# Patient Record
Sex: Female | Born: 1980 | Race: White | Hispanic: No | Marital: Single | Smoking: Current every day smoker
Health system: Southern US, Community
[De-identification: ages and names within clinical notes are randomized; demographics above are authoritative.]

## PROBLEM LIST (undated history)

## (undated) ENCOUNTER — Inpatient Hospital Stay (HOSPITAL_COMMUNITY): Payer: Self-pay

## (undated) DIAGNOSIS — F419 Anxiety disorder, unspecified: Secondary | ICD-10-CM

## (undated) DIAGNOSIS — A749 Chlamydial infection, unspecified: Secondary | ICD-10-CM

## (undated) HISTORY — DX: Chlamydial infection, unspecified: A74.9

---

## 2012-06-16 ENCOUNTER — Emergency Department (HOSPITAL_BASED_OUTPATIENT_CLINIC_OR_DEPARTMENT_OTHER)
Admission: EM | Admit: 2012-06-16 | Discharge: 2012-06-16 | Disposition: A | Discharge status: Home / Self Care | Payer: Self-pay | Attending: Emergency Medicine | Admitting: Emergency Medicine

## 2012-06-16 ENCOUNTER — Encounter (HOSPITAL_BASED_OUTPATIENT_CLINIC_OR_DEPARTMENT_OTHER): Payer: Self-pay | Admitting: *Deleted

## 2012-06-16 ENCOUNTER — Emergency Department (HOSPITAL_BASED_OUTPATIENT_CLINIC_OR_DEPARTMENT_OTHER): Payer: Self-pay

## 2012-06-16 DIAGNOSIS — Z8659 Personal history of other mental and behavioral disorders: Secondary | ICD-10-CM | POA: Insufficient documentation

## 2012-06-16 DIAGNOSIS — Z3202 Encounter for pregnancy test, result negative: Secondary | ICD-10-CM | POA: Insufficient documentation

## 2012-06-16 DIAGNOSIS — F17200 Nicotine dependence, unspecified, uncomplicated: Secondary | ICD-10-CM | POA: Insufficient documentation

## 2012-06-16 DIAGNOSIS — R0789 Other chest pain: Secondary | ICD-10-CM

## 2012-06-16 DIAGNOSIS — R071 Chest pain on breathing: Secondary | ICD-10-CM | POA: Insufficient documentation

## 2012-06-16 DIAGNOSIS — R42 Dizziness and giddiness: Secondary | ICD-10-CM | POA: Insufficient documentation

## 2012-06-16 HISTORY — DX: Anxiety disorder, unspecified: F41.9

## 2012-06-16 LAB — CBC WITH DIFFERENTIAL/PLATELET
Basophils Absolute: 0 10*3/uL (ref 0.0–0.1)
Basophils Relative: 0 % (ref 0–1)
Hemoglobin: 13.2 g/dL (ref 12.0–15.0)
MCHC: 33.9 g/dL (ref 30.0–36.0)
Monocytes Relative: 8 % (ref 3–12)
Neutro Abs: 6.3 10*3/uL (ref 1.7–7.7)
Neutrophils Relative %: 63 % (ref 43–77)
RDW: 13 % (ref 11.5–15.5)

## 2012-06-16 LAB — BASIC METABOLIC PANEL
Chloride: 104 mEq/L (ref 96–112)
GFR calc Af Amer: >90 mL/min (ref >90)
Potassium: 3.8 mEq/L (ref 3.5–5.1)

## 2012-06-16 LAB — PREGNANCY, URINE: Preg Test, Ur: NEGATIVE

## 2012-06-16 MED ORDER — KETOROLAC TROMETHAMINE 30 MG/ML IJ SOLN
30.0000 mg | Freq: Once | INTRAMUSCULAR | Status: AC
  Administered 2012-06-16: 30 mg via INTRAVENOUS
  Filled 2012-06-16: qty 1

## 2012-06-16 NOTE — ED Provider Notes (Addendum)
History    This chart was scribed for Dione Booze, MD by Donne Anon, ED Scribe. This patient was seen in room MH08/MH08 and the patient's care was started at 1743.   CSN: [id number]  Arrival date & time 06/16/12  1720   First MD Initiated Contact with Patient 06/16/12 1743      Chief Complaint  Patient presents with  . Chest Pain     The history is provided by the patient. No language interpreter was used.   Maria Mendez is a 32 y.o. female who presents to the Emergency Department complaining of sudden onset, intermittent, unchanging left sided CP described as throbbing which began 4 hours PTA while she was driving. She reports the pain is worse when sitting up and laying down. She reports nothing makes it feel better. She reports associated dizziness. She denies SOB, nausea, emesis or any other pain. She has tried nitroglycerin which made the pain worse. She reports she is otherwise healthy and has not had any recent colds. She reports 1 prior episode years ago.  Pt is a current everyday smoker (.5 pack/day) but denies alcohol use. She does not have a PCP.   Past Medical History  Diagnosis Date  . Anxiety     History reviewed. No pertinent past surgical history.  History reviewed. No pertinent family history.  History  Substance Use Topics  . Smoking status: Current Every Day Smoker -- 0.50 packs/day    Types: Cigarettes  . Smokeless tobacco: Not on file  . Alcohol Use: No     Review of Systems  All other systems reviewed and are negative.    Allergies  Review of patient's allergies indicates no known allergies.  Home Medications  No current outpatient prescriptions on file.  Triage Vitals; BP 112/69  Pulse 81  Temp(Src) 98.3 F (36.8 C) (Oral)  Resp 16  Ht 5\' 3"  (1.6 m)  Wt 186 lb (84.369 kg)  BMI 32.96 kg/m2  SpO2 99%  LMP 05/28/2012  Physical Exam  Nursing note and vitals reviewed. Constitutional: She is oriented to person, place, and time. She  appears well-developed and well-nourished. No distress.  HENT:  Head: Normocephalic and atraumatic.  Eyes: EOM are normal.  Neck: Neck supple. No tracheal deviation present.  Cardiovascular: Normal rate.   Pulmonary/Chest: Effort normal. No respiratory distress. She exhibits tenderness (left anterior chest wall).  Musculoskeletal: Normal range of motion.  Neurological: She is alert and oriented to person, place, and time.  Skin: Skin is warm and dry.  Psychiatric: She has a normal mood and affect. Her behavior is normal.    ED Course  Procedures (including critical care time) DIAGNOSTIC STUDIES: Oxygen Saturation is 99% on room air, normal by my interpretation.    COORDINATION OF CARE: 5:59 PM Discussed treatment plan which includes xray and blood work with pt at bedside and pt agreed to plan.   Results for orders placed during the hospital encounter of 06/16/12  CBC WITH DIFFERENTIAL      Result Value Range   WBC 10.0  4.0 - 10.5 K/uL   RBC 4.10  3.87 - 5.11 MIL/uL   Hemoglobin 13.2  12.0 - 15.0 g/dL   HCT 21.3  08.6 - 57.8 %   MCV 94.9  78.0 - 100.0 fL   MCH 32.2  26.0 - 34.0 pg   MCHC 33.9  30.0 - 36.0 g/dL   RDW 46.9  62.9 - 52.8 %   Platelets 276  150 - 400 K/uL  Neutrophils Relative 63  43 - 77 %   Neutro Abs 6.3  1.7 - 7.7 K/uL   Lymphocytes Relative 27  12 - 46 %   Lymphs Abs 2.7  0.7 - 4.0 K/uL   Monocytes Relative 8  3 - 12 %   Monocytes Absolute 0.8  0.1 - 1.0 K/uL   Eosinophils Relative 2  0 - 5 %   Eosinophils Absolute 0.2  0.0 - 0.7 K/uL   Basophils Relative 0  0 - 1 %   Basophils Absolute 0.0  0.0 - 0.1 K/uL  BASIC METABOLIC PANEL      Result Value Range   Sodium 140  135 - 145 mEq/L   Potassium 3.8  3.5 - 5.1 mEq/L   Chloride 104  96 - 112 mEq/L   CO2 26  19 - 32 mEq/L   Glucose, Bld 109 (*) 70 - 99 mg/dL   BUN 8  6 - 23 mg/dL   Creatinine, Ser 1.61  0.50 - 1.10 mg/dL   Calcium 9.5  8.4 - 09.6 mg/dL   GFR calc non Af Amer >90  >90 mL/min   GFR  calc Af Amer >90  >90 mL/min  PREGNANCY, URINE      Result Value Range   Preg Test, Ur NEGATIVE  NEGATIVE   Dg Chest 2 View  06/16/2012  *RADIOLOGY REPORT*  Clinical Data: Chest pain.  CHEST - 2 VIEW  Comparison: None  Findings: Heart and mediastinal contours are within normal limits. No focal opacities or effusions.  No acute bony abnormality.  IMPRESSION: No active cardiopulmonary disease.   Original Report Authenticated By: Charlett Nose, M.D.    ECG shows normal sinus rhythm with a rate of 86, no ectopy. Normal axis. Normal P wave. Normal QRS. Normal intervals. Normal ST and T waves. Impression: normal ECG. No prior ECG available for comparison   1. Chest wall pain       MDM  Chest pain which seems most likely be chest wall pain-possible costochondritis. Chest x-ray or be obtained and she will be given a therapeutic trial of ketorolac.  She feels much better after ketorolac. Laboratory and x-ray studies are normal. She is sent home with instructions to use over-the-counter NSAIDs as needed.  I personally performed the services described in this documentation, which was scribed in my presence. The recorded information has been reviewed and is accurate.           Dione Booze, MD 06/16/12 [place]  Dione Booze, MD 06/16/12 2001

## 2012-06-16 NOTE — ED Notes (Signed)
Patient transported to X-ray 

## 2012-06-16 NOTE — ED Notes (Signed)
Pt c/o cp while standing in [employer] x 3 hrs ago , pt reports she took a fmaily members nitro which made it worse

## 2012-10-01 LAB — OB RESULTS CONSOLE HGB/HCT, BLOOD: HCT: 38 %

## 2012-10-13 DIAGNOSIS — A749 Chlamydial infection, unspecified: Secondary | ICD-10-CM

## 2012-10-13 HISTORY — DX: Chlamydial infection, unspecified: A74.9

## 2012-10-13 LAB — OB RESULTS CONSOLE GC/CHLAMYDIA
Chlamydia: POSITIVE
Chlamydia: POSITIVE
Gonorrhea: NEGATIVE

## 2012-10-13 LAB — OB RESULTS CONSOLE RPR: RPR: NONREACTIVE

## 2012-10-13 LAB — SICKLE CELL SCREEN: Sickle Cell Screen: NORMAL

## 2012-10-13 LAB — OB RESULTS CONSOLE VARICELLA ZOSTER ANTIBODY, IGG: Varicella: IMMUNE

## 2012-10-14 ENCOUNTER — Other Ambulatory Visit (HOSPITAL_COMMUNITY): Payer: Self-pay | Admitting: Obstetrics and Gynecology

## 2012-10-14 DIAGNOSIS — Z3682 Encounter for antenatal screening for nuchal translucency: Secondary | ICD-10-CM

## 2012-10-24 ENCOUNTER — Encounter: Payer: Self-pay | Admitting: Obstetrics and Gynecology

## 2012-10-27 ENCOUNTER — Encounter: Payer: Self-pay | Admitting: Obstetrics & Gynecology

## 2012-11-03 ENCOUNTER — Encounter: Payer: Self-pay | Admitting: *Deleted

## 2012-11-06 ENCOUNTER — Encounter (HOSPITAL_COMMUNITY): Payer: Self-pay | Admitting: Obstetrics and Gynecology

## 2012-11-14 ENCOUNTER — Ambulatory Visit (HOSPITAL_COMMUNITY): Payer: Self-pay

## 2014-01-30 IMAGING — CR DG CHEST 2V
2 series · 2 of 2 positions shown · non-contrast
Comparison: None

CLINICAL DATA: Chest pain.

CHEST - 2 VIEW

[w chest pa]
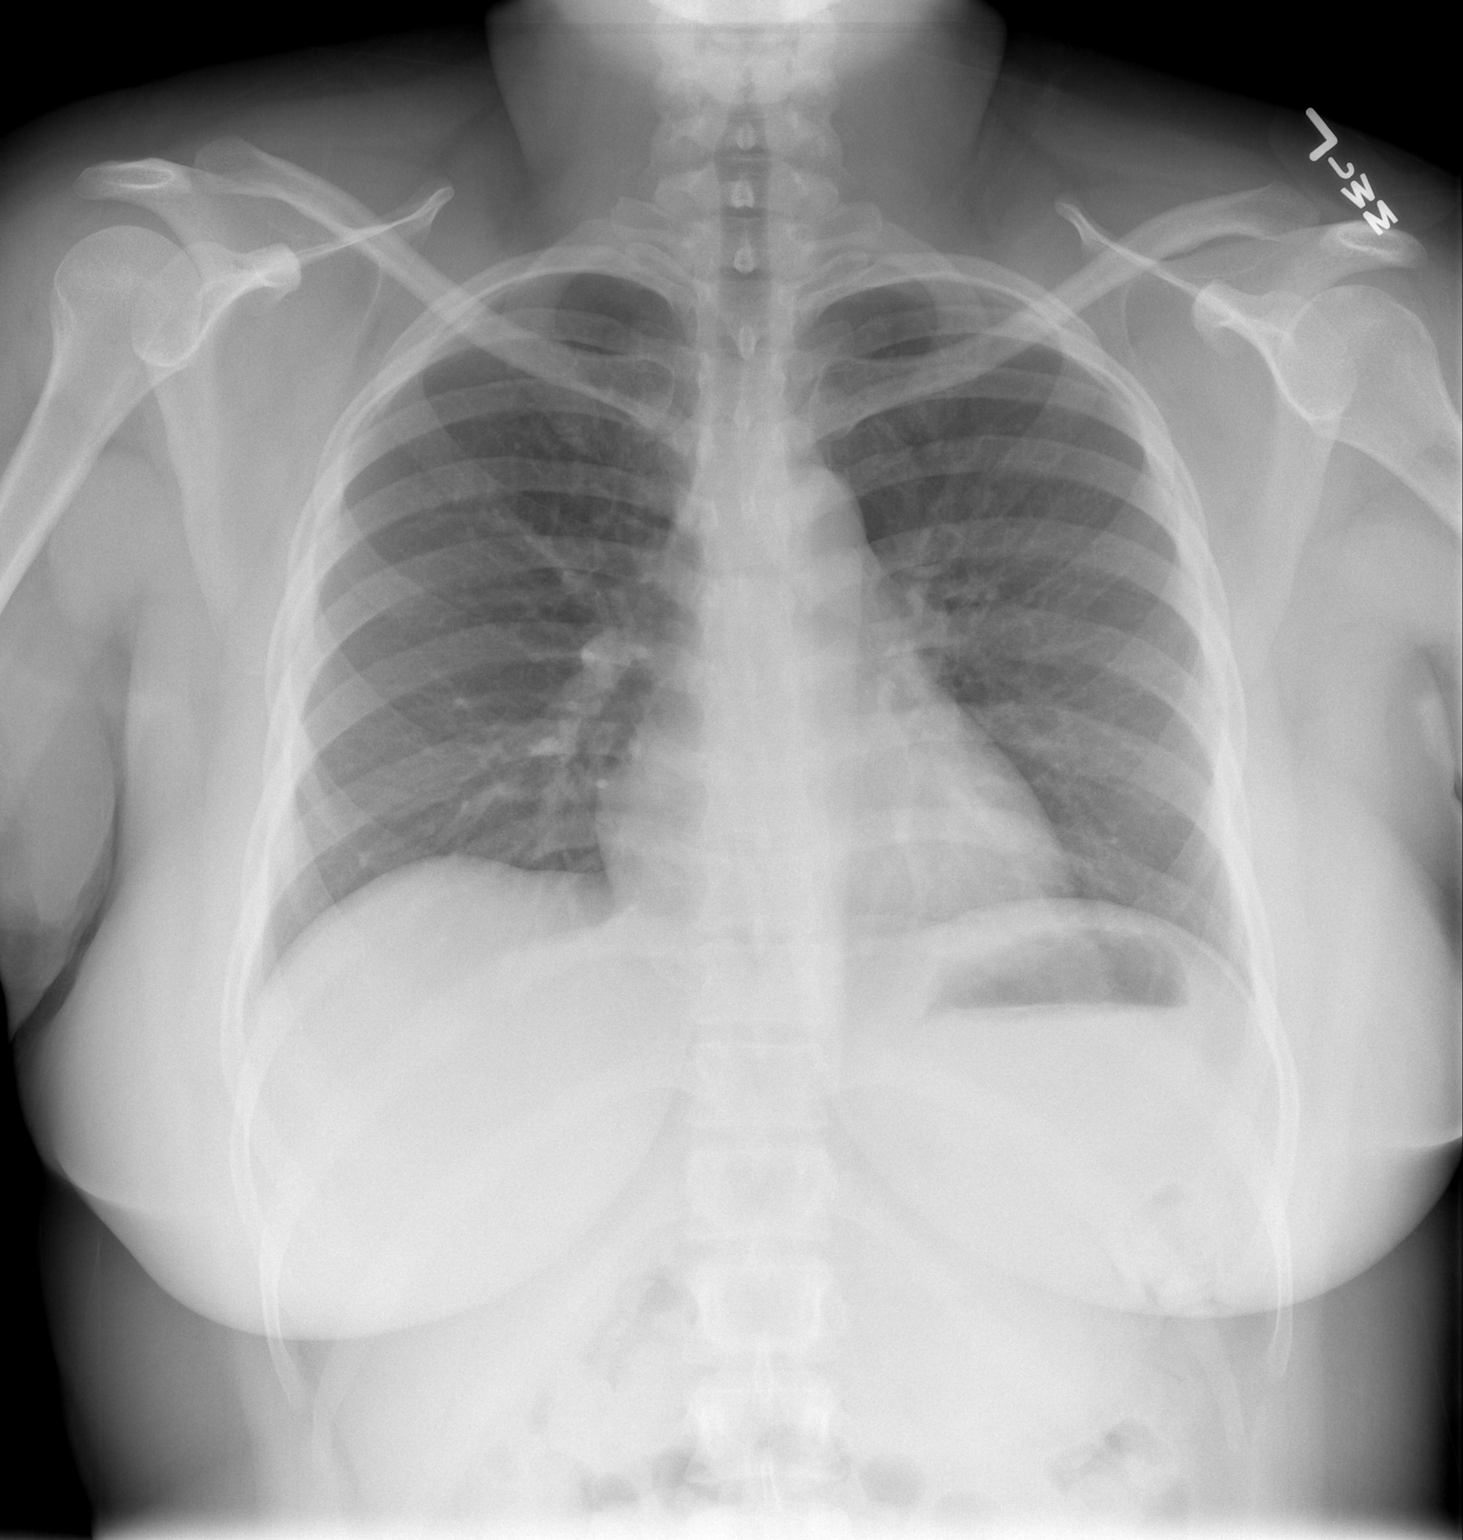

[w chest lat]
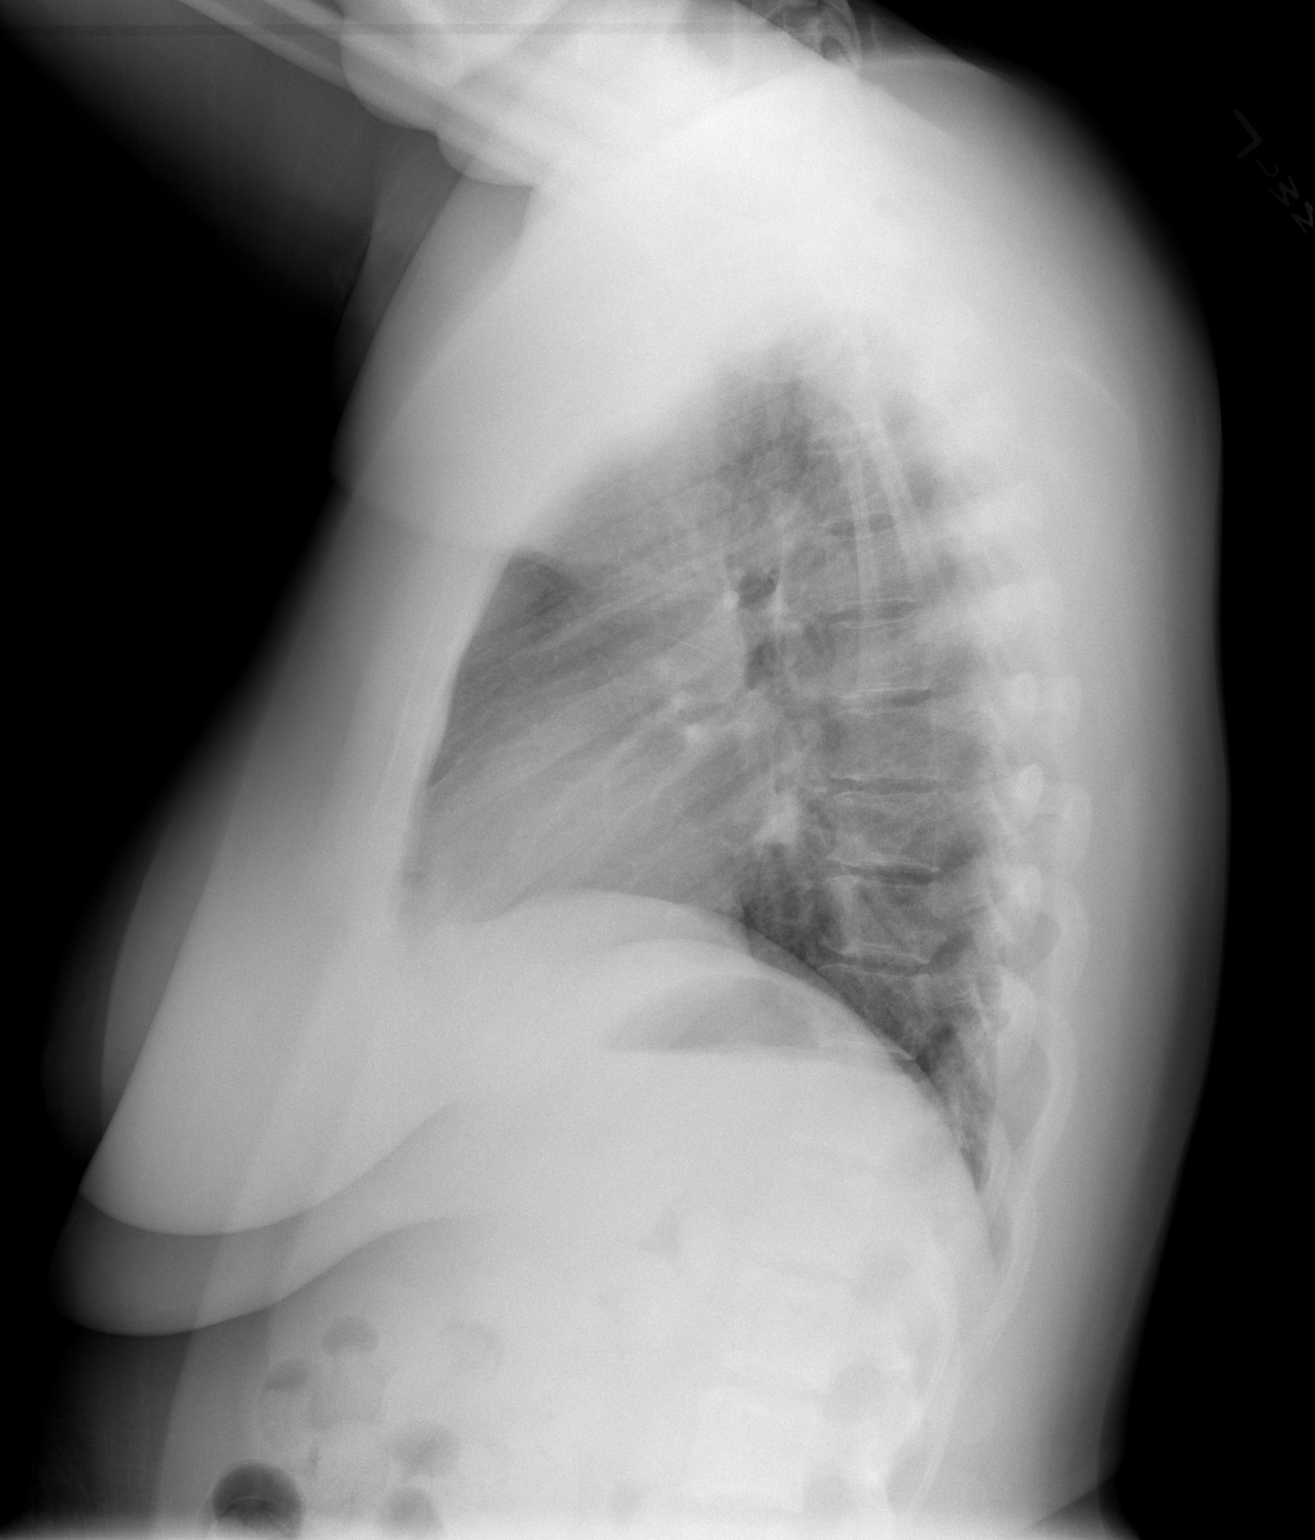

[2 of 2 positions shown; findings below may reference images not displayed]

FINDINGS: Heart and mediastinal contours are within normal limits.
No focal opacities or effusions.  No acute bony abnormality.
IMPRESSION: No active cardiopulmonary disease.

## 2015-04-08 ENCOUNTER — Emergency Department (HOSPITAL_BASED_OUTPATIENT_CLINIC_OR_DEPARTMENT_OTHER)
Admission: EM | Admit: 2015-04-08 | Discharge: 2015-04-08 | Disposition: A | Discharge status: Home / Self Care | Payer: Self-pay | Attending: Emergency Medicine | Admitting: Emergency Medicine

## 2015-04-08 ENCOUNTER — Encounter (HOSPITAL_BASED_OUTPATIENT_CLINIC_OR_DEPARTMENT_OTHER): Payer: Self-pay | Admitting: *Deleted

## 2015-04-08 DIAGNOSIS — S39012A Strain of muscle, fascia and tendon of lower back, initial encounter: Secondary | ICD-10-CM | POA: Insufficient documentation

## 2015-04-08 DIAGNOSIS — F419 Anxiety disorder, unspecified: Secondary | ICD-10-CM | POA: Insufficient documentation

## 2015-04-08 DIAGNOSIS — Z8619 Personal history of other infectious and parasitic diseases: Secondary | ICD-10-CM | POA: Insufficient documentation

## 2015-04-08 DIAGNOSIS — Y998 Other external cause status: Secondary | ICD-10-CM | POA: Insufficient documentation

## 2015-04-08 DIAGNOSIS — Y9389 Activity, other specified: Secondary | ICD-10-CM | POA: Insufficient documentation

## 2015-04-08 DIAGNOSIS — M545 Low back pain, unspecified: Secondary | ICD-10-CM

## 2015-04-08 DIAGNOSIS — X58XXXA Exposure to other specified factors, initial encounter: Secondary | ICD-10-CM | POA: Insufficient documentation

## 2015-04-08 DIAGNOSIS — F17210 Nicotine dependence, cigarettes, uncomplicated: Secondary | ICD-10-CM | POA: Insufficient documentation

## 2015-04-08 DIAGNOSIS — Z3202 Encounter for pregnancy test, result negative: Secondary | ICD-10-CM | POA: Insufficient documentation

## 2015-04-08 DIAGNOSIS — T148XXA Other injury of unspecified body region, initial encounter: Secondary | ICD-10-CM

## 2015-04-08 DIAGNOSIS — Y9289 Other specified places as the place of occurrence of the external cause: Secondary | ICD-10-CM | POA: Insufficient documentation

## 2015-04-08 LAB — URINALYSIS, ROUTINE W REFLEX MICROSCOPIC
Bilirubin Urine: NEGATIVE
GLUCOSE, UA: NEGATIVE mg/dL
KETONES UR: NEGATIVE mg/dL
Nitrite: NEGATIVE
PH: 6 (ref 5.0–8.0)
PROTEIN: NEGATIVE mg/dL
Specific Gravity, Urine: 1.02 (ref 1.005–1.030)

## 2015-04-08 LAB — URINE MICROSCOPIC-ADD ON

## 2015-04-08 LAB — PREGNANCY, URINE: PREG TEST UR: NEGATIVE

## 2015-04-08 MED ORDER — CYCLOBENZAPRINE HCL 10 MG PO TABS
10.0000 mg | ORAL_TABLET | Freq: Once | ORAL | Status: DC
  Filled 2015-04-08: qty 1

## 2015-04-08 MED ORDER — KETOROLAC TROMETHAMINE 60 MG/2ML IM SOLN
60.0000 mg | Freq: Once | INTRAMUSCULAR | Status: AC
  Administered 2015-04-08: 60 mg via INTRAMUSCULAR
  Filled 2015-04-08: qty 2

## 2015-04-08 MED ORDER — DIAZEPAM 5 MG PO TABS: 5.0000 mg | ORAL_TABLET | Freq: Three times a day (TID) | ORAL | Status: DC | PRN

## 2015-04-08 MED ORDER — IBUPROFEN 600 MG PO TABS: 600.0000 mg | ORAL_TABLET | Freq: Four times a day (QID) | ORAL | Status: DC | PRN

## 2015-04-08 NOTE — ED Provider Notes (Signed)
CSN: [id number]     Arrival date & time 04/08/15  0848 History   First MD Initiated Contact with Patient 04/08/15 [phone]     Chief Complaint  Patient presents with  . Back Pain     (Consider location/radiation/quality/duration/timing/severity/associated sxs/prior Treatment) HPI 34 year old female who presents with back pain. History of intermittent back pain. Woke up this morning with stiff back and spasms, which seem to radiated to the left side of her thigh. Has had mild back pain over the course of the past several days, but this morning seemed to be much worse. Has not had any numbness or weakness, urinary retention, fecal or urinary incontinence, abdominal pain, dysuria, urinary frequency, hematuria, vaginal bleeding or discharge, nausea or vomiting, fevers or chills. No recent falls or trauma. Past Medical History  Diagnosis Date  . Anxiety   . Chlamydia 10/13/12    Treated 10/16/12 @ [facility]   History reviewed. No pertinent past surgical history. Family History  Problem Relation Age of Onset  . Diabetes Mother   . Cancer Mother     bone  . Cancer Father     skin   Social History  Substance Use Topics  . Smoking status: Current Every Day Smoker -- 0.50 packs/day    Types: Cigarettes  . Smokeless tobacco: None  . Alcohol Use: No   OB History    No data available     Review of Systems 10/14 systems reviewed and are negative other than those stated in the HPI   Allergies  Review of patient's allergies indicates no known allergies.  Home Medications   Prior to Admission medications   Medication Sig Start Date End Date Taking? Authorizing Provider  diazepam (VALIUM) 5 MG tablet Take 1 tablet (5 mg total) by mouth every 8 (eight) hours as needed for muscle spasms. 04/08/15   Lavera Guise, MD  ibuprofen (ADVIL,MOTRIN) 600 MG tablet Take 1 tablet (600 mg total) by mouth every 6 (six) hours as needed. 04/08/15   Lavera Guise, MD   BP 122/75 mmHg  Pulse 76  Temp(Src) 98.6 F  (37 C) (Oral)  Resp 20  Ht  (1.6 m)  Wt 182 lb (82.555 kg)  BMI 32.25 kg/m2  SpO2 100% Physical Exam Physical Exam  Nursing note and vitals reviewed. Constitutional: Well developed, well nourished, non-toxic, and in no acute distress Head: Normocephalic and atraumatic.  Mouth/Throat: Oropharynx is clear and moist.  Neck: Normal range of motion. Neck supple.  Cardiovascular: Normal rate and regular rhythm.   Pulmonary/Chest: Effort normal and breath sounds normal.  Abdominal: Soft. There is no tenderness. There is no rebound and no guarding.  Musculoskeletal: No midline tenderness of the TLS spine. There is bilateral paraspinal muscle tenderness to palpation in the lumbar region of her spine. There is no CVA tenderness.  Neurological: Alert, no facial droop, fluent speech, full strength in hip flexion/extension, knee flexion/extension, ankle dorsi and plantar flexion, intact sensation in bilateral lower extremities due to light touch Skin: Skin is warm and dry.  Psychiatric: Cooperative  ED Course  Procedures (including critical care time) Labs Review Labs Reviewed  URINALYSIS, ROUTINE W REFLEX MICROSCOPIC (NOT AT [facility]) - Abnormal; Notable for the following:    APPearance CLOUDY (*)    Hgb urine dipstick LARGE (*)    Leukocytes, UA MODERATE (*)    All other components within normal limits  URINE MICROSCOPIC-ADD ON - Abnormal; Notable for the following:    Squamous Epithelial /  LPF 6-30 (*)    Bacteria, UA MANY (*)    All other components within normal limits  PREGNANCY, URINE    Imaging Review No results found. I have personally reviewed and evaluated these images and lab results as part of my medical decision-making.    MDM   Final diagnoses:  Bilateral low back pain without sciatica  Muscle strain    34 year old female with low back pain. Is well-appearing and in no acute distress. Vital signs are non-concerning. She has a soft and benign abdomen. His no CVA  tenderness or urinary symptoms to be concerned for pyelonephritis or urolithiasis. She has reproducible paraspinal tenderness of her lumbar spine bilaterally, and is neurologically intact. No concerning features of her history or exam that would be concerning for serious spinal cord or spine pathology. Is given Toradol and Flexeril for symptomatic control, to good effect. Discussed supportive care instructions for home. Strict return and follow-up instructions are reviewed. She expressed understanding of all discharge instructions and felt comfortable with the plan of care.    Lavera Guiseana Duo Brooks Kinnan, MD 04/08/15 [phone]

## 2015-04-08 NOTE — ED Notes (Signed)
Pt amb to restroom for repeat urine sample. Pt is unable to void. Pt states she has to go and pick up her children from school. md is aware.

## 2015-04-08 NOTE — ED Notes (Signed)
MD at bedside. 

## 2015-04-08 NOTE — Discharge Instructions (Signed)
Please return without fail for worsening symptoms, including worsening pain, difficulty walking, numbness or weakness, loss of control of her bowel or bladder, fevers, vomiting unable to keep down food or fluids, or any other symptoms concerning to you.  Back Pain, Adult Back pain is very common in adults.The cause of back pain is rarely dangerous and the pain often gets better over time.The cause of your back pain may not be known. Some common causes of back pain include:  Strain of the muscles or ligaments supporting the spine.  Wear and tear (degeneration) of the spinal disks.  Arthritis.  Direct injury to the back. For many people, back pain may return. Since back pain is rarely dangerous, most people can learn to manage this condition on their own. HOME CARE INSTRUCTIONS Watch your back pain for any changes. The following actions may help to lessen any discomfort you are feeling:  Remain active. It is stressful on your back to sit or stand in one place for long periods of time. Do not sit, drive, or stand in one place for more than 30 minutes at a time. Take short walks on even surfaces as soon as you are able.Try to increase the length of time you walk each day.  Exercise regularly as directed by your health care provider. Exercise helps your back heal faster. It also helps avoid future injury by keeping your muscles strong and flexible.  Do not stay in bed.Resting more than 1-2 days can delay your recovery.  Pay attention to your body when you bend and lift. The most comfortable positions are those that put less stress on your recovering back. Always use proper lifting techniques, including:  Bending your knees.  Keeping the load close to your body.  Avoiding twisting.  Find a comfortable position to sleep. Use a firm mattress and lie on your side with your knees slightly bent. If you lie on your back, put a pillow under your knees.  Avoid feeling anxious or  stressed.Stress increases muscle tension and can worsen back pain.It is important to recognize when you are anxious or stressed and learn ways to manage it, such as with exercise.  Take medicines only as directed by your health care provider. Over-the-counter medicines to reduce pain and inflammation are often the most helpful.Your health care provider may prescribe muscle relaxant drugs.These medicines help dull your pain so you can more quickly return to your normal activities and healthy exercise.  Apply ice to the injured area:  Put ice in a plastic bag.  Place a towel between your skin and the bag.  Leave the ice on for 20 minutes, 2-3 times a day for the first 2-3 days. After that, ice and heat may be alternated to reduce pain and spasms.  Maintain a healthy weight. Excess weight puts extra stress on your back and makes it difficult to maintain good posture. SEEK MEDICAL CARE IF:  You have pain that is not relieved with rest or medicine.  You have increasing pain going down into the legs or buttocks.  You have pain that does not improve in one week.  You have night pain.  You lose weight.  You have a fever or chills. SEEK IMMEDIATE MEDICAL CARE IF:   You develop new bowel or bladder control problems.  You have unusual weakness or numbness in your arms or legs.  You develop nausea or vomiting.  You develop abdominal pain.  You feel faint.   This information is not intended to  replace advice given to you by your health care provider. Make sure you discuss any questions you have with your health care provider.   Document Released: 04/23/2005 Document Revised: 05/14/2014 Document Reviewed: 08/25/2013 Elsevier Interactive Patient Education [employer].

## 2015-04-08 NOTE — ED Notes (Signed)
Pt amb to room 11 reporting back pain x Wednesday. Pt states "I wonder if I'm pregnant?" pt amb to restroom for cc urine sample. Pt denies any injury or trauma.

## 2015-08-17 ENCOUNTER — Encounter (HOSPITAL_COMMUNITY): Payer: Self-pay | Admitting: *Deleted

## 2015-08-17 ENCOUNTER — Inpatient Hospital Stay (HOSPITAL_COMMUNITY): Payer: Medicaid Other

## 2015-08-17 ENCOUNTER — Inpatient Hospital Stay (HOSPITAL_COMMUNITY)
Admission: AD | Admit: 2015-08-17 | Discharge: 2015-08-17 | Disposition: A | Discharge status: Home / Self Care | Payer: Medicaid Other | Source: Ambulatory Visit | Attending: Obstetrics and Gynecology | Admitting: Obstetrics and Gynecology

## 2015-08-17 DIAGNOSIS — O26852 Spotting complicating pregnancy, second trimester: Secondary | ICD-10-CM | POA: Diagnosis present

## 2015-08-17 DIAGNOSIS — Z8619 Personal history of other infectious and parasitic diseases: Secondary | ICD-10-CM | POA: Insufficient documentation

## 2015-08-17 DIAGNOSIS — Z3A14 14 weeks gestation of pregnancy: Secondary | ICD-10-CM | POA: Diagnosis not present

## 2015-08-17 DIAGNOSIS — O209 Hemorrhage in early pregnancy, unspecified: Secondary | ICD-10-CM | POA: Insufficient documentation

## 2015-08-17 DIAGNOSIS — O4692 Antepartum hemorrhage, unspecified, second trimester: Secondary | ICD-10-CM

## 2015-08-17 DIAGNOSIS — Z833 Family history of diabetes mellitus: Secondary | ICD-10-CM | POA: Diagnosis not present

## 2015-08-17 DIAGNOSIS — F419 Anxiety disorder, unspecified: Secondary | ICD-10-CM | POA: Insufficient documentation

## 2015-08-17 DIAGNOSIS — F17210 Nicotine dependence, cigarettes, uncomplicated: Secondary | ICD-10-CM | POA: Diagnosis not present

## 2015-08-17 DIAGNOSIS — Z808 Family history of malignant neoplasm of other organs or systems: Secondary | ICD-10-CM | POA: Diagnosis not present

## 2015-08-17 LAB — URINALYSIS, ROUTINE W REFLEX MICROSCOPIC
Bilirubin Urine: NEGATIVE
GLUCOSE, UA: NEGATIVE mg/dL
Ketones, ur: NEGATIVE mg/dL
Leukocytes, UA: NEGATIVE
Nitrite: NEGATIVE
PROTEIN: NEGATIVE mg/dL
Specific Gravity, Urine: <1.005 — ABNORMAL LOW (ref 1.005–1.030)
pH: 5.5 (ref 5.0–8.0)

## 2015-08-17 LAB — URINE MICROSCOPIC-ADD ON

## 2015-08-17 LAB — CBC WITH DIFFERENTIAL/PLATELET
BASOS PCT: 0 %
Basophils Absolute: 0 10*3/uL (ref 0.0–0.1)
EOS ABS: 0.1 10*3/uL (ref 0.0–0.7)
Eosinophils Relative: 1 %
HEMATOCRIT: 34.6 % — AB (ref 36.0–46.0)
HEMOGLOBIN: 12 g/dL (ref 12.0–15.0)
Lymphocytes Relative: 20 %
Lymphs Abs: 2.9 10*3/uL (ref 0.7–4.0)
MCH: 32.5 pg (ref 26.0–34.0)
MCHC: 34.7 g/dL (ref 30.0–36.0)
MCV: 93.8 fL (ref 78.0–100.0)
Monocytes Absolute: 0.3 10*3/uL (ref 0.1–1.0)
Monocytes Relative: 2 %
NEUTROS ABS: 11.3 10*3/uL — AB (ref 1.7–7.7)
NEUTROS PCT: 77 %
Platelets: 238 10*3/uL (ref 150–400)
RBC: 3.69 MIL/uL — AB (ref 3.87–5.11)
RDW: 13.6 % (ref 11.5–15.5)
WBC: 14.6 10*3/uL — AB (ref 4.0–10.5)

## 2015-08-17 LAB — WET PREP, GENITAL
SPERM: NONE SEEN
TRICH WET PREP: NONE SEEN
YEAST WET PREP: NONE SEEN

## 2015-08-17 LAB — RAPID HIV SCREEN (HIV 1/2 AB+AG)
HIV 1/2 Antibodies: NONREACTIVE
HIV-1 P24 ANTIGEN - HIV24: NONREACTIVE

## 2015-08-17 MED ORDER — METRONIDAZOLE 500 MG PO TABS: 500.0000 mg | ORAL_TABLET | Freq: Two times a day (BID) | ORAL | Status: AC

## 2015-08-17 NOTE — Discharge Instructions (Signed)
Vaginal Bleeding During Pregnancy, First Trimester °A small amount of bleeding (spotting) from the vagina is relatively common in early pregnancy. It usually stops on its own. Various things may cause bleeding or spotting in early pregnancy. Some bleeding may be related to the pregnancy, and some may not. In most cases, the bleeding is normal and is not a problem. However, bleeding can also be a sign of something serious. Be sure to tell your health care provider about any vaginal bleeding right away. °Some possible causes of vaginal bleeding during the first trimester include: °· Infection or inflammation of the cervix. °· Growths (polyps) on the cervix. °· Miscarriage or threatened miscarriage. °· Pregnancy tissue has developed outside of the uterus and in a fallopian tube (tubal pregnancy). °· Tiny cysts have developed in the uterus instead of pregnancy tissue (molar pregnancy). °HOME CARE INSTRUCTIONS  °Watch your condition for any changes. The following actions may help to lessen any discomfort you are feeling: °· Follow your health care provider's instructions for limiting your activity. If your health care provider orders bed rest, you may need to stay in bed and only get up to use the bathroom. However, your health care provider may allow you to continue light activity. °· If needed, make plans for someone to help with your regular activities and responsibilities while you are on bed rest. °· Keep track of the number of pads you use each day, how often you change pads, and how soaked (saturated) they are. Write this down. °· Do not use tampons. Do not douche. °· Do not have sexual intercourse or orgasms until approved by your health care provider. °· If you pass any tissue from your vagina, save the tissue so you can show it to your health care provider. °· Only take over-the-counter or prescription medicines as directed by your health care provider. °· Do not take aspirin because it can make you  bleed. °· Keep all follow-up appointments as directed by your health care provider. °SEEK MEDICAL CARE IF: °· You have any vaginal bleeding during any part of your pregnancy. °· You have cramps or labor pains. °· You have a fever, not controlled by medicine. °SEEK IMMEDIATE MEDICAL CARE IF:  °· You have severe cramps in your back or belly (abdomen). °· You pass large clots or tissue from your vagina. °· Your bleeding increases. °· You feel light-headed or weak, or you have fainting episodes. °· You have chills. °· You are leaking fluid or have a gush of fluid from your vagina. °· You pass out while having a bowel movement. °MAKE SURE YOU: °· Understand these instructions. °· Will watch your condition. °· Will get help right away if you are not doing well or get worse. °  °This information is not intended to replace advice given to you by your health care provider. Make sure you discuss any questions you have with your health care provider. °  °Document Released: 01/31/2005 Document Revised: 04/28/2013 Document Reviewed: 12/29/2012 °Elsevier Interactive Patient Education ©2016 Elsevier Inc. ° °Pelvic Rest °Pelvic rest is sometimes recommended for women when:  °· The placenta is partially or completely covering the opening of the cervix (placenta previa). °· There is bleeding between the uterine wall and the amniotic sac in the first trimester (subchorionic hemorrhage). °· The cervix begins to open without labor starting (incompetent cervix, cervical insufficiency). °· The labor is too early (preterm labor). °HOME CARE INSTRUCTIONS °· Do not have sexual intercourse, stimulation, or an orgasm. °· Do   not use tampons, douche, or put anything in the vagina.  Do not lift anything over 10 pounds (4.5 kg).  Avoid strenuous activity or straining your pelvic muscles. SEEK MEDICAL CARE IF:  You have any vaginal bleeding during pregnancy. Treat this as a potential emergency.  You have cramping pain felt low in the  stomach (stronger than menstrual cramps).  You notice vaginal discharge (watery, mucus, or bloody).  You have a low, dull backache.  There are regular contractions or uterine tightening. SEEK IMMEDIATE MEDICAL CARE IF: You have vaginal bleeding and have placenta previa.    This information is not intended to replace advice given to you by your health care provider. Make sure you discuss any questions you have with your health care provider.   Document Released: 08/18/2010 Document Revised: 07/16/2011 Document Reviewed: 10/25/2014 Elsevier Interactive Patient Education 2016 Elsevier Inc. Bacterial Vaginosis Bacterial vaginosis is a vaginal infection that occurs when the normal balance of bacteria in the vagina is disrupted. It results from an overgrowth of certain bacteria. This is the most common vaginal infection in women of childbearing age. Treatment is important to prevent complications, especially in pregnant women, as it can cause a premature delivery. CAUSES  Bacterial vaginosis is caused by an increase in harmful bacteria that are normally present in smaller amounts in the vagina. Several different kinds of bacteria can cause bacterial vaginosis. However, the reason that the condition develops is not fully understood. RISK FACTORS Certain activities or behaviors can put you at an increased risk of developing bacterial vaginosis, including:  Having a new sex partner or multiple sex partners.  Douching.  Using an intrauterine device (IUD) for contraception. Women do not get bacterial vaginosis from toilet seats, bedding, swimming pools, or contact with objects around them. SIGNS AND SYMPTOMS  Some women with bacterial vaginosis have no signs or symptoms. Common symptoms include:  Grey vaginal discharge.  A fishlike odor with discharge, especially after sexual intercourse.  Itching or burning of the vagina and vulva.  Burning or pain with urination. DIAGNOSIS  Your health  care provider will take a medical history and examine the vagina for signs of bacterial vaginosis. A sample of vaginal fluid may be taken. Your health care provider will look at this sample under a microscope to check for bacteria and abnormal cells. A vaginal pH test may also be done.  TREATMENT  Bacterial vaginosis may be treated with antibiotic medicines. These may be given in the form of a pill or a vaginal cream. A second round of antibiotics may be prescribed if the condition comes back after treatment. Because bacterial vaginosis increases your risk for sexually transmitted diseases, getting treated can help reduce your risk for chlamydia, gonorrhea, HIV, and herpes. HOME CARE INSTRUCTIONS   Only take over-the-counter or prescription medicines as directed by your health care provider.  If antibiotic medicine was prescribed, take it as directed. Make sure you finish it even if you start to feel better.  Tell all sexual partners that you have a vaginal infection. They should see their health care provider and be treated if they have problems, such as a mild rash or itching.  During treatment, it is important that you follow these instructions:  Avoid sexual activity or use condoms correctly.  Do not douche.  Avoid alcohol as directed by your health care provider.  Avoid breastfeeding as directed by your health care provider. SEEK MEDICAL CARE IF:   Your symptoms are not improving after 3 days of  treatment.  You have increased discharge or pain.  You have a fever. MAKE SURE YOU:   Understand these instructions.  Will watch your condition.  Will get help right away if you are not doing well or get worse. FOR MORE INFORMATION  Centers for Disease Control and Prevention, Division of STD Prevention: [employer].co.zawww.cdc.gov/std American Sexual Health Association (ASHA): www.ashastd.org    This information is not intended to replace advice given to you by your health care provider. Make sure  you discuss any questions you have with your health care provider.   Document Released: 04/23/2005 Document Revised: 05/14/2014 Document Reviewed: 12/03/2012 Elsevier Interactive Patient Education [employer]2016 Elsevier Inc.

## 2015-08-17 NOTE — MAU Note (Signed)
Pt states has been cramping for last couple of days.  Today noted light pink spotting after using restroom.  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

## 2015-08-17 NOTE — MAU Provider Note (Signed)
History     CSN: [id number]  Arrival date and time: 08/17/15 2008   First Provider Initiated Contact with Patient 08/17/15 2041      No chief complaint on file.  HPI Maria Mendez is 35 y.o. [id number].  LMP 05/07/15.  By dates she is 7475w4d  presenting with concern for miscarriage.  Hx of 3  previous miscarriages (last one 03/2015) and feels this may be another one.  Cramping earlier this week, stopped and then today bleeding without cramping.  Described as pink, light spotting tonight.  Last intercourse 2 days ago.  Has begun prenatal care with [name].  At last OB appt 3/15, she was told U/S showed blood vessels.  They also told her she was a week behind her dates by LMP.     Past Medical History  Diagnosis Date  . Anxiety   . Chlamydia 10/13/12    Treated 10/16/12 @ [facility]    History reviewed. No pertinent past surgical history.  Family History  Problem Relation Age of Onset  . Diabetes Mother   . Cancer Mother     bone  . Cancer Father     skin    Social History  Substance Use Topics  . Smoking status: Current Every Day Smoker -- 0.25 packs/day    Types: Cigarettes  . Smokeless tobacco: None  . Alcohol Use: No    Allergies: No Known Allergies  Prescriptions prior to admission  Medication Sig Dispense Refill Last Dose  . diazepam (VALIUM) 5 MG tablet Take 1 tablet (5 mg total) by mouth every 8 (eight) hours as needed for muscle spasms. 10 tablet 0   . ibuprofen (ADVIL,MOTRIN) 600 MG tablet Take 1 tablet (600 mg total) by mouth every 6 (six) hours as needed. 30 tablet 0     Review of Systems  Constitutional: Negative for fever and chills.  Gastrointestinal: Positive for abdominal pain (crapming earlier this week, neg today). Negative for nausea and vomiting.  Genitourinary: Negative for dysuria, urgency, frequency and hematuria.       + for vaginal bleeding  Neurological: Negative for headaches.   Physical Exam   Blood pressure 125/81.  Physical Exam   Nursing note and vitals reviewed. Constitutional: She is oriented to person, place, and time. She appears well-developed and well-nourished. No distress.  HENT:  Head: Normocephalic.  Neck: Normal range of motion.  Respiratory: Effort normal.  GI: Soft. She exhibits no distension and no mass. There is no tenderness. There is no rebound and no guarding.  Genitourinary: There is no rash, tenderness or lesion on the right labia. There is no rash, tenderness or lesion on the left labia. Uterus is enlarged (13-14  week size). Uterus is not tender. Cervix exhibits no motion tenderness, no discharge and no friability. There is bleeding (scant pink blood noted.  Neg for active bleeding) in the vagina. Vaginal discharge (pink tinged frothy discharge with odor) found.  Neurological: She is alert and oriented to person, place, and time.  Skin: Skin is warm and dry.  Psychiatric: She has a normal mood and affect. Her behavior is normal.   Per Previous chart BLOOD TYPE A positive  MAU Course  Procedures  GC/CHL culture to lab-pending results  MDM MSE Exam Labs U/S   21:00 [facility] Mayford KnifeWilliams, CNM Assessment and Plan  A: Vaginal bleeding at 14w4 d gestation by LMP      History of multiple miscarriages     KEY,EVE M 08/17/2015,  8:41 PM   Assumed care US Ob Comp Less 14 Wks  08/17/2015  CLINICAL DATA:  Acute onset of vaginal bleeding.  Initial encounter. EXAM: OBSTETRIC <14 WK ULTRASOUND TECHNIQUE: Transabdominal ultrasound was performed for evaluation of the gestation as well as the maternal uterus and adnexal regions. COMPARISON:  Pelvic ultrasound performed 06/10/2015 FINDINGS: Intrauterine gestational sac:  Visualized; normal in shape. Yolk sac:  No Embryo:  Yes Cardiac Activity: Yes Heart Rate: 171 bpm CRL:   7.55 cm   13 w 4 d                  [place] EDC: 02/18/2016 Subchorionic hemorrhage:  None visualized. Maternal uterus/adnexae: The uterus is otherwise unremarkable. The ovaries  are within normal limits. The right ovary measures 4.5 x 1.6 x 1.7 cm, while the left ovary measures 2.9 x 1.9 x 1.5 cm. No suspicious adnexal masses are seen; there is no evidence for ovarian torsion. No free fluid is seen within the pelvic cul-de-sac. IMPRESSION: Single live intrauterine pregnancy noted, with a crown-rump length of 7.6 cm, corresponding to a gestational age of [redacted] weeks 4 days. This matches the gestational age of [redacted] weeks 4 days by LMP, reflecting an estimated date of delivery of February 11, 2016. Electronically Signed   By: Roanna Raider M.D.   On: 08/17/2015 22:12   Reviewed [place] results with patient She is still very worried about miscarriage Reassured no evidence of this Discussed BV, pt states already knew she had it, MD at [facility] will treat her I put in orders for tx anyway Discharge home Aviva Signs, CNM

## 2015-08-17 NOTE — MAU Note (Signed)
Assumed care of patient.

## 2015-08-18 LAB — GC/CHLAMYDIA PROBE AMP (~~LOC~~) NOT AT ARMC
CHLAMYDIA, DNA PROBE: NEGATIVE
Neisseria Gonorrhea: NEGATIVE

## 2016-06-21 ENCOUNTER — Encounter (HOSPITAL_COMMUNITY): Payer: Self-pay

## 2017-04-01 IMAGING — US US OB COMP LESS 14 WK
1 series · 15 of 22 positions shown · non-contrast
Comparison: Pelvic ultrasound performed 06/10/2015

CLINICAL DATA: Acute onset of vaginal bleeding.  Initial encounter.

EXAM:
OBSTETRIC <14 WK ULTRASOUND
TECHNIQUE: Transabdominal ultrasound was performed for evaluation of the
gestation as well as the maternal uterus and adnexal regions.

[Series 1: us ob comp less 14 wk · 15 of 22 slices shown]
[im 1/22]
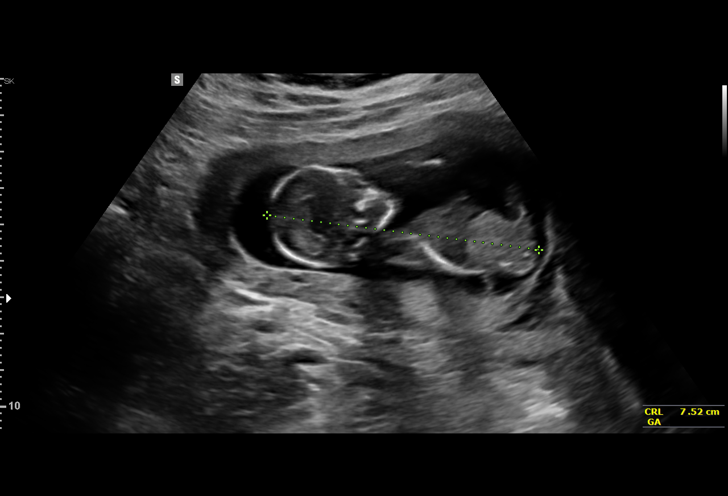
[im 3/22]
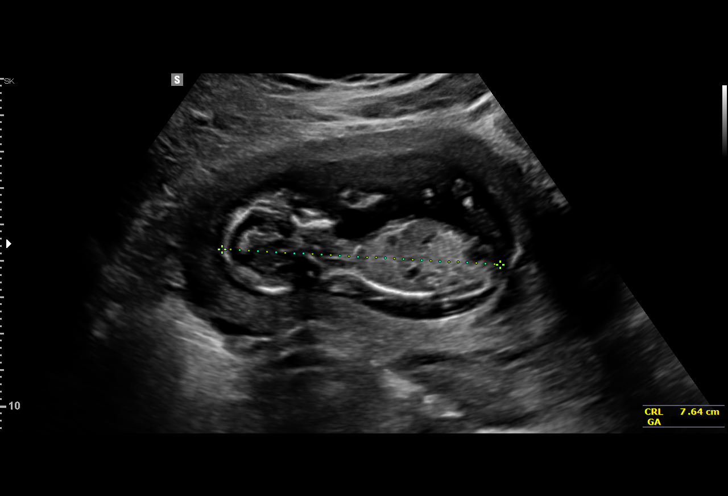
[im 4/22]
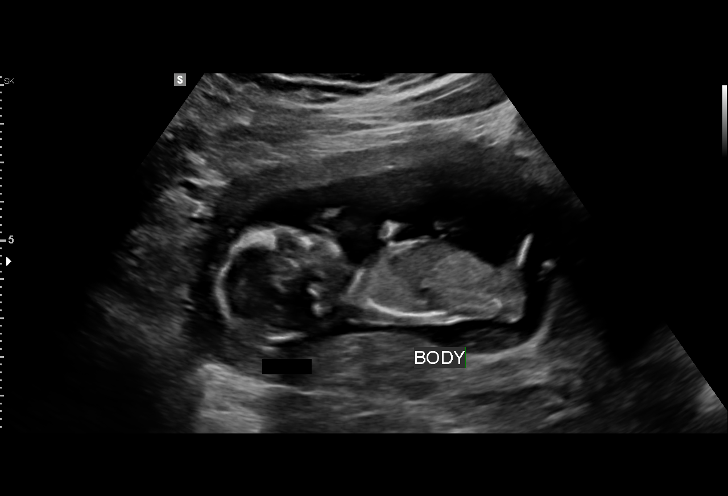
[im 6/22]
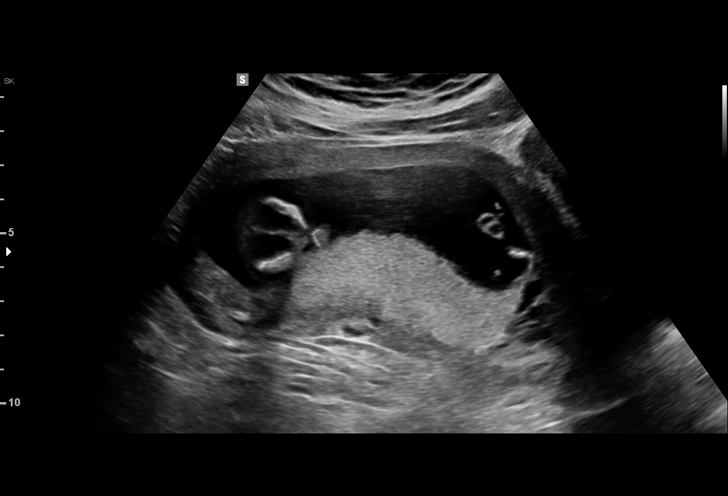
[im 7/22]
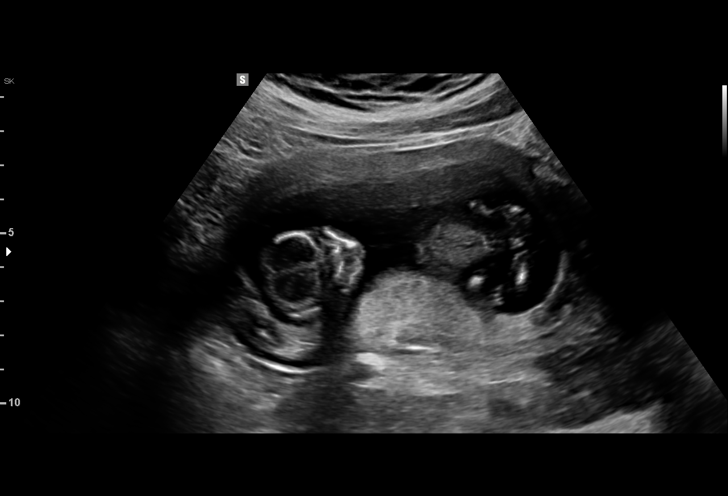
[im 9/22]
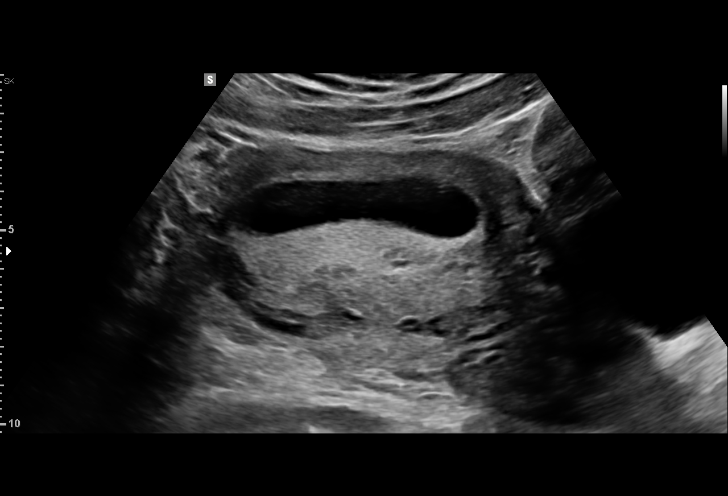
[im 10/22]
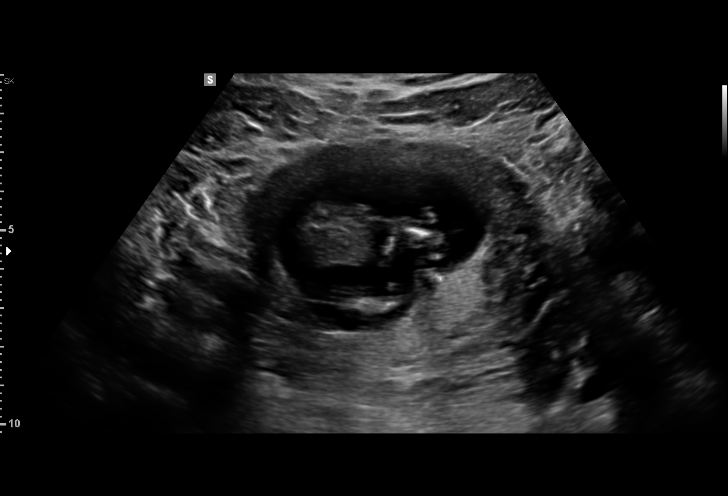
[im 12/22]
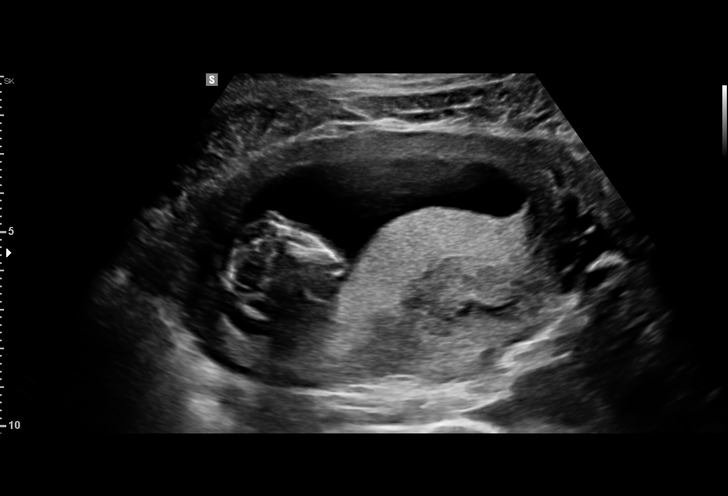
[im 13/22]
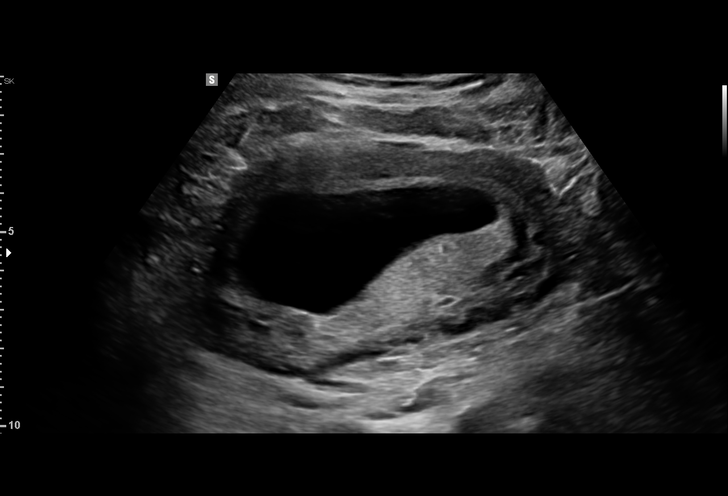
[im 14/22]
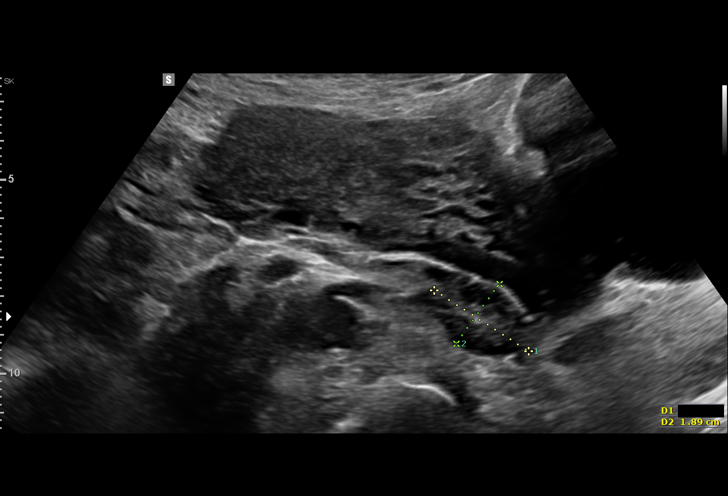
[im 16/22]
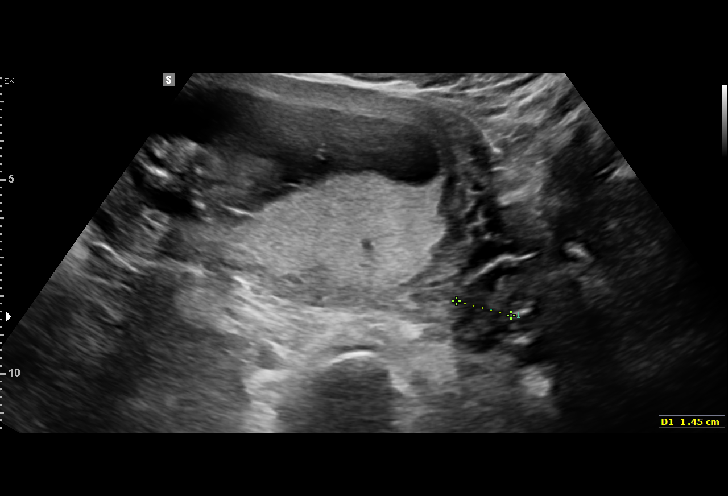
[im 17/22]
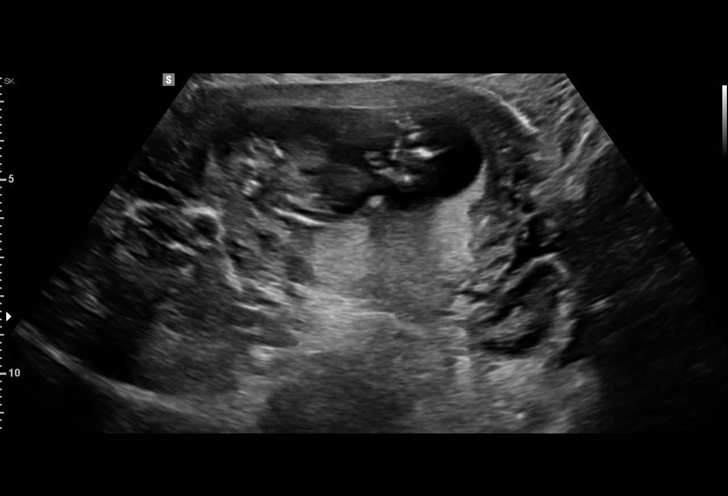
[im 19/22]
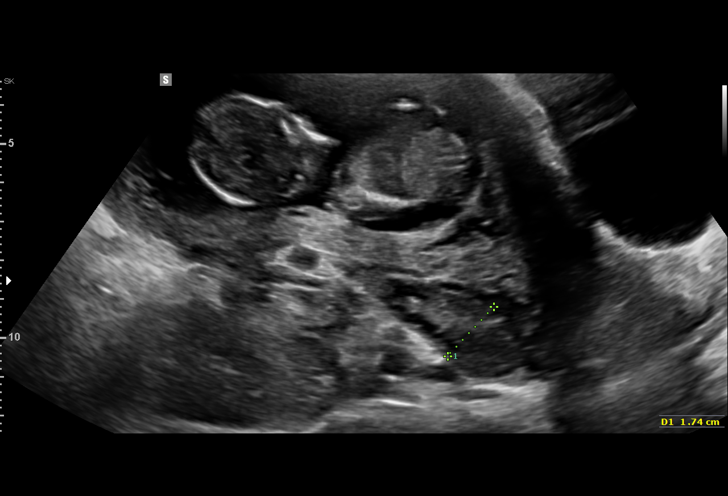
[im 20/22]
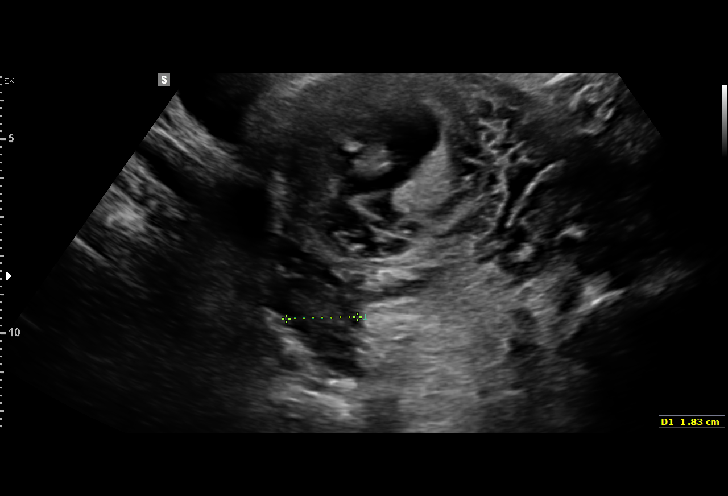
[im 22/22]
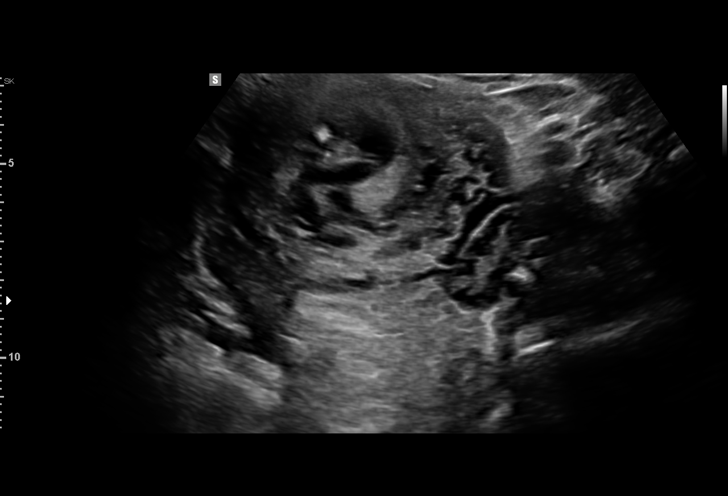

[15 of 22 positions shown; findings below may reference images not displayed]

FINDINGS: Intrauterine gestational sac:  Visualized; normal in shape.

Yolk sac:  No

Embryo:  Yes

Cardiac Activity: Yes

Heart Rate: 171 bpm

CRL:   7.55 cm   13 w 4 d                  US EDC: 02/18/2016

Subchorionic hemorrhage:  None visualized.

Maternal uterus/adnexae: The uterus is otherwise unremarkable.

The ovaries are within normal limits. The right ovary measures 4.5 x
1.6 x 1.7 cm, while the left ovary measures 2.9 x 1.9 x 1.5 cm. No
suspicious adnexal masses are seen; there is no evidence for ovarian
torsion.

No free fluid is seen within the pelvic cul-de-sac.
IMPRESSION: Single live intrauterine pregnancy noted, with a crown-rump length
of 7.6 cm, corresponding to a gestational age of 13 weeks 4 days.
This matches the gestational age of 14 weeks 4 days by LMP,
reflecting an estimated date of delivery February 11, 2016.
